# Patient Record
Sex: Female | Born: 1970 | Race: White | Hispanic: No | Marital: Single
Health system: Southern US, Community
[De-identification: ages and names within clinical notes are randomized; demographics above are authoritative.]

---

## 2019-06-21 ENCOUNTER — Encounter: Payer: Self-pay | Admitting: *Deleted

## 2019-06-21 ENCOUNTER — Other Ambulatory Visit: Payer: Self-pay | Admitting: Neurology

## 2019-06-21 DIAGNOSIS — M5416 Radiculopathy, lumbar region: Secondary | ICD-10-CM

## 2019-06-27 ENCOUNTER — Ambulatory Visit
Admission: RE | Admit: 2019-06-27 | Discharge: 2019-06-27 | Disposition: A | Discharge status: Home / Self Care | Payer: Medicaid Other | Source: Ambulatory Visit | Attending: Neurology | Admitting: Neurology

## 2019-06-27 ENCOUNTER — Other Ambulatory Visit: Payer: Self-pay

## 2019-06-27 DIAGNOSIS — M5416 Radiculopathy, lumbar region: Secondary | ICD-10-CM

## 2019-06-27 MED ORDER — IOPAMIDOL (ISOVUE-M 200) INJECTION 41%: 1.0000 mL | Freq: Once | INTRAMUSCULAR | Status: DC

## 2019-06-27 MED ORDER — METHYLPREDNISOLONE ACETATE 40 MG/ML INJ SUSP (RADIOLOG: 120.0000 mg | Freq: Once | INTRAMUSCULAR | Status: DC

## 2019-06-27 NOTE — Discharge Instructions (Signed)

## 2020-02-26 ENCOUNTER — Other Ambulatory Visit: Payer: Self-pay | Admitting: Neurology

## 2020-02-26 ENCOUNTER — Other Ambulatory Visit: Payer: Self-pay

## 2020-02-26 ENCOUNTER — Ambulatory Visit (HOSPITAL_COMMUNITY)
Admission: RE | Admit: 2020-02-26 | Discharge: 2020-02-26 | Disposition: A | Discharge status: Home / Self Care | Payer: Medicaid Other | Source: Ambulatory Visit | Attending: Neurology | Admitting: Neurology

## 2020-02-26 ENCOUNTER — Other Ambulatory Visit (HOSPITAL_COMMUNITY): Payer: Self-pay | Admitting: Neurology

## 2020-02-26 DIAGNOSIS — M5416 Radiculopathy, lumbar region: Secondary | ICD-10-CM

## 2020-02-26 DIAGNOSIS — M25561 Pain in right knee: Secondary | ICD-10-CM

## 2020-03-01 ENCOUNTER — Other Ambulatory Visit: Payer: Self-pay

## 2020-03-01 ENCOUNTER — Ambulatory Visit
Admission: RE | Admit: 2020-03-01 | Discharge: 2020-03-01 | Disposition: A | Discharge status: Home / Self Care | Payer: Medicaid Other | Source: Ambulatory Visit | Attending: Neurology | Admitting: Neurology

## 2020-03-01 DIAGNOSIS — M5416 Radiculopathy, lumbar region: Secondary | ICD-10-CM

## 2020-03-01 MED ORDER — IOPAMIDOL (ISOVUE-M 200) INJECTION 41%
1.0000 mL | Freq: Once | INTRAMUSCULAR | Status: AC
  Administered 2020-03-01: 1 mL via EPIDURAL

## 2020-03-01 MED ORDER — METHYLPREDNISOLONE ACETATE 40 MG/ML INJ SUSP (RADIOLOG
120.0000 mg | Freq: Once | INTRAMUSCULAR | Status: AC
  Administered 2020-03-01: 120 mg via EPIDURAL

## 2020-03-01 NOTE — Discharge Instructions (Signed)

## 2021-07-29 ENCOUNTER — Ambulatory Visit: Payer: Self-pay | Admitting: *Deleted

## 2021-09-22 ENCOUNTER — Other Ambulatory Visit: Payer: Self-pay | Admitting: Neurology

## 2021-09-22 DIAGNOSIS — M5412 Radiculopathy, cervical region: Secondary | ICD-10-CM

## 2021-10-01 ENCOUNTER — Ambulatory Visit
Admission: RE | Admit: 2021-10-01 | Discharge: 2021-10-01 | Disposition: A | Discharge status: Home / Self Care | Payer: Medicaid Other | Source: Ambulatory Visit | Attending: Neurology | Admitting: Neurology

## 2021-10-01 ENCOUNTER — Other Ambulatory Visit: Payer: Self-pay

## 2021-10-01 DIAGNOSIS — M5412 Radiculopathy, cervical region: Secondary | ICD-10-CM

## 2021-10-01 MED ORDER — IOPAMIDOL (ISOVUE-M 300) INJECTION 61%
1.0000 mL | Freq: Once | INTRAMUSCULAR | Status: AC
  Administered 2021-10-01: 1 mL via EPIDURAL

## 2021-10-01 MED ORDER — TRIAMCINOLONE ACETONIDE 40 MG/ML IJ SUSP (RADIOLOGY)
60.0000 mg | Freq: Once | INTRAMUSCULAR | Status: AC
  Administered 2021-10-01: 60 mg via EPIDURAL

## 2021-10-01 NOTE — Discharge Instructions (Signed)

## 2022-04-20 ENCOUNTER — Other Ambulatory Visit: Payer: Self-pay | Admitting: Neurology

## 2022-04-20 DIAGNOSIS — M5416 Radiculopathy, lumbar region: Secondary | ICD-10-CM

## 2022-04-23 ENCOUNTER — Ambulatory Visit
Admission: RE | Admit: 2022-04-23 | Discharge: 2022-04-23 | Disposition: A | Discharge status: Home / Self Care | Payer: Medicaid Other | Source: Ambulatory Visit | Attending: Neurology | Admitting: Neurology

## 2022-04-23 DIAGNOSIS — M5416 Radiculopathy, lumbar region: Secondary | ICD-10-CM

## 2022-04-23 MED ORDER — METHYLPREDNISOLONE ACETATE 40 MG/ML INJ SUSP (RADIOLOG
80.0000 mg | Freq: Once | INTRAMUSCULAR | Status: AC
  Administered 2022-04-23: 80 mg via EPIDURAL

## 2022-04-23 MED ORDER — IOPAMIDOL (ISOVUE-M 200) INJECTION 41%
1.0000 mL | Freq: Once | INTRAMUSCULAR | Status: AC
  Administered 2022-04-23: 1 mL via INTRA_ARTICULAR

## 2022-04-23 NOTE — Discharge Instructions (Signed)

## 2022-05-05 ENCOUNTER — Other Ambulatory Visit: Payer: Self-pay | Admitting: Internal Medicine

## 2022-05-05 DIAGNOSIS — Z139 Encounter for screening, unspecified: Secondary | ICD-10-CM

## 2022-07-14 ENCOUNTER — Other Ambulatory Visit: Payer: Self-pay | Admitting: Neurology

## 2022-07-14 DIAGNOSIS — M5412 Radiculopathy, cervical region: Secondary | ICD-10-CM

## 2022-07-24 NOTE — Discharge Instructions (Signed)

## 2022-07-27 ENCOUNTER — Ambulatory Visit
Admission: RE | Admit: 2022-07-27 | Discharge: 2022-07-27 | Disposition: A | Discharge status: Home / Self Care | Payer: Medicaid Other | Source: Ambulatory Visit | Attending: Neurology | Admitting: Neurology

## 2022-07-27 DIAGNOSIS — M5412 Radiculopathy, cervical region: Secondary | ICD-10-CM

## 2022-07-27 MED ORDER — TRIAMCINOLONE ACETONIDE 40 MG/ML IJ SUSP (RADIOLOGY)
60.0000 mg | Freq: Once | INTRAMUSCULAR | Status: AC
  Administered 2022-07-27: 60 mg via EPIDURAL

## 2022-07-27 MED ORDER — IOPAMIDOL (ISOVUE-M 300) INJECTION 61%
1.0000 mL | Freq: Once | INTRAMUSCULAR | Status: AC | PRN
  Administered 2022-07-27: 1 mL via EPIDURAL

## 2023-11-23 IMAGING — XA Imaging study
2 series · 2 of 2 positions shown · non-contrast
Comparison: none

CLINICAL DATA: 50-year-old female with lumbosacral spondylosis
without myelopathy. She has undergone prior injections at L4-L5 with
approximately 70% relief. Unfortunately, her symptoms have recurred.
She presents for repeat left L4-L5 RTOYOTA.

[Series 1: ortho adipose · 1 of 1 slices shown (1 of 2)]
[im 1/1]
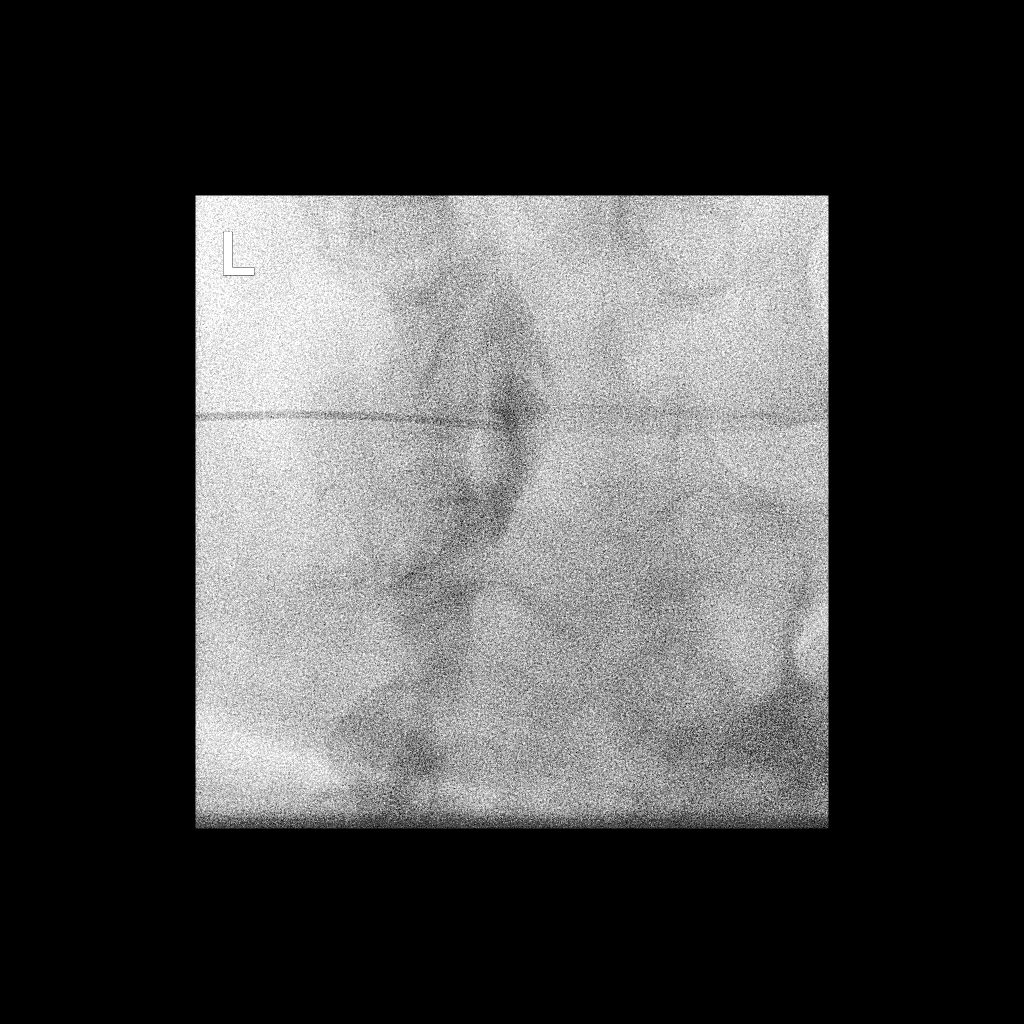

[Series 3: ortho adipose · 1 of 1 slices shown (2 of 2)]
[im 1/1]
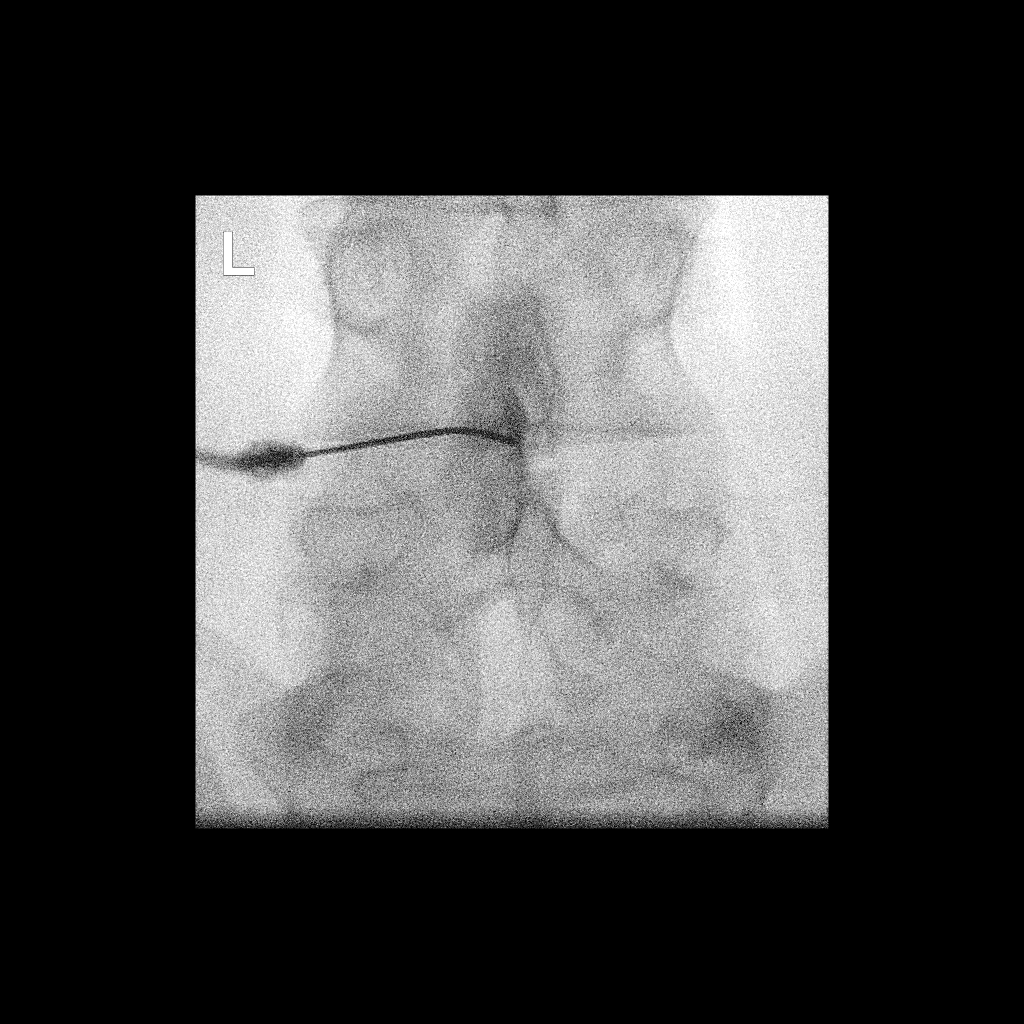

[2 of 2 positions shown; findings below may reference images not displayed]

FLUOROSCOPY:
Radiation Exposure Index (as provided by the fluoroscopic device):
6.4 mGy Kerma

PROCEDURE:
The procedure, risks, benefits, and alternatives were explained to
the patient. Questions regarding the procedure were encouraged and
answered. The patient understands and consents to the procedure.

LUMBAR EPIDURAL INJECTION:

An interlaminar approach was performed on left at L4-L5. The
overlying skin was cleansed and anesthetized. A 20 gauge epidural
needle was advanced using loss-of-resistance technique.

DIAGNOSTIC EPIDURAL INJECTION:

Injection of Isovue-M 200 shows a good epidural pattern with spread
above and below the level of needle placement, primarily on the left
no vascular opacification is seen.

THERAPEUTIC EPIDURAL INJECTION:

80 mg of Depo-Medrol mixed with 2 mL 1% lidocaine were instilled.
The procedure was well-tolerated, and the patient was discharged
thirty minutes following the injection in good condition.

COMPLICATIONS:
None.
IMPRESSION: Technically successful epidural injection on the left L4-L5.
